# Patient Record
Sex: Female | Born: 1975 | Race: Asian | Hispanic: No | Marital: Married | Smoking: Never smoker
Health system: Southern US, Community
[De-identification: ages and names within clinical notes are randomized; demographics above are authoritative.]

---

## 2013-07-12 ENCOUNTER — Other Ambulatory Visit: Payer: Self-pay

## 2013-07-21 ENCOUNTER — Other Ambulatory Visit (HOSPITAL_COMMUNITY): Payer: Self-pay | Admitting: Obstetrics and Gynecology

## 2013-07-21 ENCOUNTER — Encounter (HOSPITAL_COMMUNITY): Payer: Self-pay | Admitting: Obstetrics and Gynecology

## 2013-07-21 DIAGNOSIS — O09529 Supervision of elderly multigravida, unspecified trimester: Secondary | ICD-10-CM

## 2013-07-21 DIAGNOSIS — IMO0001 Reserved for inherently not codable concepts without codable children: Secondary | ICD-10-CM

## 2013-07-21 DIAGNOSIS — Z3689 Encounter for other specified antenatal screening: Secondary | ICD-10-CM

## 2013-08-17 ENCOUNTER — Other Ambulatory Visit (HOSPITAL_COMMUNITY): Payer: Self-pay | Admitting: Obstetrics and Gynecology

## 2013-08-17 ENCOUNTER — Ambulatory Visit (HOSPITAL_COMMUNITY)
Admission: RE | Admit: 2013-08-17 | Discharge: 2013-08-17 | Disposition: A | Discharge status: Home / Self Care | Payer: BC Managed Care – PPO | Source: Ambulatory Visit | Attending: Obstetrics and Gynecology | Admitting: Obstetrics and Gynecology

## 2013-08-17 ENCOUNTER — Encounter (HOSPITAL_COMMUNITY): Payer: Self-pay

## 2013-08-17 ENCOUNTER — Ambulatory Visit (HOSPITAL_COMMUNITY): Payer: BC Managed Care – PPO

## 2013-08-17 VITALS — BP 115/74 | HR 90 | Wt 124.5 lb

## 2013-08-17 DIAGNOSIS — Z3689 Encounter for other specified antenatal screening: Secondary | ICD-10-CM

## 2013-08-17 DIAGNOSIS — O358XX0 Maternal care for other (suspected) fetal abnormality and damage, not applicable or unspecified: Secondary | ICD-10-CM | POA: Insufficient documentation

## 2013-08-17 DIAGNOSIS — Z363 Encounter for antenatal screening for malformations: Secondary | ICD-10-CM | POA: Insufficient documentation

## 2013-08-17 DIAGNOSIS — Z1389 Encounter for screening for other disorder: Secondary | ICD-10-CM | POA: Insufficient documentation

## 2013-08-17 DIAGNOSIS — O09529 Supervision of elderly multigravida, unspecified trimester: Secondary | ICD-10-CM | POA: Insufficient documentation

## 2013-08-17 DIAGNOSIS — IMO0001 Reserved for inherently not codable concepts without codable children: Secondary | ICD-10-CM

## 2013-08-17 DIAGNOSIS — O30009 Twin pregnancy, unspecified number of placenta and unspecified number of amniotic sacs, unspecified trimester: Secondary | ICD-10-CM | POA: Insufficient documentation

## 2013-08-17 DIAGNOSIS — O34219 Maternal care for unspecified type scar from previous cesarean delivery: Secondary | ICD-10-CM | POA: Insufficient documentation

## 2013-08-25 ENCOUNTER — Ambulatory Visit (HOSPITAL_COMMUNITY)
Admission: RE | Admit: 2013-08-25 | Discharge: 2013-08-25 | Disposition: A | Discharge status: Home / Self Care | Payer: BC Managed Care – PPO | Source: Ambulatory Visit | Attending: Obstetrics and Gynecology | Admitting: Obstetrics and Gynecology

## 2013-08-25 ENCOUNTER — Encounter (HOSPITAL_COMMUNITY): Payer: Self-pay

## 2013-08-25 ENCOUNTER — Other Ambulatory Visit (HOSPITAL_COMMUNITY): Payer: Self-pay | Admitting: Maternal and Fetal Medicine

## 2013-08-25 DIAGNOSIS — O34219 Maternal care for unspecified type scar from previous cesarean delivery: Secondary | ICD-10-CM | POA: Insufficient documentation

## 2013-08-25 DIAGNOSIS — IMO0001 Reserved for inherently not codable concepts without codable children: Secondary | ICD-10-CM

## 2013-08-25 DIAGNOSIS — O30009 Twin pregnancy, unspecified number of placenta and unspecified number of amniotic sacs, unspecified trimester: Secondary | ICD-10-CM | POA: Insufficient documentation

## 2013-08-25 DIAGNOSIS — O09529 Supervision of elderly multigravida, unspecified trimester: Secondary | ICD-10-CM | POA: Insufficient documentation

## 2013-09-02 ENCOUNTER — Ambulatory Visit (HOSPITAL_COMMUNITY)
Admission: RE | Admit: 2013-09-02 | Discharge: 2013-09-02 | Disposition: A | Discharge status: Home / Self Care | Payer: BC Managed Care – PPO | Source: Ambulatory Visit | Attending: Obstetrics and Gynecology | Admitting: Obstetrics and Gynecology

## 2013-09-02 ENCOUNTER — Other Ambulatory Visit (HOSPITAL_COMMUNITY): Payer: Self-pay | Admitting: Maternal and Fetal Medicine

## 2013-09-02 DIAGNOSIS — O34219 Maternal care for unspecified type scar from previous cesarean delivery: Secondary | ICD-10-CM | POA: Insufficient documentation

## 2013-09-02 DIAGNOSIS — IMO0001 Reserved for inherently not codable concepts without codable children: Secondary | ICD-10-CM

## 2013-09-02 DIAGNOSIS — O30009 Twin pregnancy, unspecified number of placenta and unspecified number of amniotic sacs, unspecified trimester: Secondary | ICD-10-CM | POA: Insufficient documentation

## 2013-09-02 DIAGNOSIS — O09529 Supervision of elderly multigravida, unspecified trimester: Secondary | ICD-10-CM | POA: Insufficient documentation

## 2013-09-07 ENCOUNTER — Other Ambulatory Visit (HOSPITAL_COMMUNITY): Payer: Self-pay | Admitting: Maternal and Fetal Medicine

## 2013-09-07 DIAGNOSIS — IMO0001 Reserved for inherently not codable concepts without codable children: Secondary | ICD-10-CM

## 2013-09-09 ENCOUNTER — Encounter (HOSPITAL_COMMUNITY): Payer: Self-pay

## 2013-09-09 ENCOUNTER — Ambulatory Visit (HOSPITAL_COMMUNITY)
Admission: RE | Admit: 2013-09-09 | Discharge: 2013-09-09 | Disposition: A | Discharge status: Home / Self Care | Payer: BC Managed Care – PPO | Source: Ambulatory Visit | Attending: Obstetrics and Gynecology | Admitting: Obstetrics and Gynecology

## 2013-09-09 ENCOUNTER — Ambulatory Visit (HOSPITAL_COMMUNITY): Payer: BC Managed Care – PPO

## 2013-09-09 DIAGNOSIS — O09529 Supervision of elderly multigravida, unspecified trimester: Secondary | ICD-10-CM | POA: Insufficient documentation

## 2013-09-09 DIAGNOSIS — O30009 Twin pregnancy, unspecified number of placenta and unspecified number of amniotic sacs, unspecified trimester: Secondary | ICD-10-CM | POA: Insufficient documentation

## 2013-09-09 DIAGNOSIS — O34219 Maternal care for unspecified type scar from previous cesarean delivery: Secondary | ICD-10-CM | POA: Insufficient documentation

## 2013-09-09 DIAGNOSIS — IMO0001 Reserved for inherently not codable concepts without codable children: Secondary | ICD-10-CM

## 2013-09-15 ENCOUNTER — Ambulatory Visit (HOSPITAL_COMMUNITY)
Admission: RE | Admit: 2013-09-15 | Discharge: 2013-09-15 | Disposition: A | Discharge status: Home / Self Care | Payer: BC Managed Care – PPO | Source: Ambulatory Visit | Attending: Obstetrics and Gynecology | Admitting: Obstetrics and Gynecology

## 2013-09-15 VITALS — BP 120/69 | HR 97 | Wt 135.0 lb

## 2013-09-15 DIAGNOSIS — O09529 Supervision of elderly multigravida, unspecified trimester: Secondary | ICD-10-CM | POA: Insufficient documentation

## 2013-09-15 DIAGNOSIS — O34219 Maternal care for unspecified type scar from previous cesarean delivery: Secondary | ICD-10-CM | POA: Insufficient documentation

## 2013-09-15 DIAGNOSIS — O30009 Twin pregnancy, unspecified number of placenta and unspecified number of amniotic sacs, unspecified trimester: Secondary | ICD-10-CM | POA: Insufficient documentation

## 2013-09-15 DIAGNOSIS — IMO0001 Reserved for inherently not codable concepts without codable children: Secondary | ICD-10-CM

## 2013-09-23 ENCOUNTER — Encounter (HOSPITAL_COMMUNITY): Payer: Self-pay

## 2013-09-23 ENCOUNTER — Ambulatory Visit (HOSPITAL_COMMUNITY)
Admission: RE | Admit: 2013-09-23 | Discharge: 2013-09-23 | Disposition: A | Discharge status: Home / Self Care | Payer: BC Managed Care – PPO | Source: Ambulatory Visit | Attending: Obstetrics and Gynecology | Admitting: Obstetrics and Gynecology

## 2013-09-23 DIAGNOSIS — O30009 Twin pregnancy, unspecified number of placenta and unspecified number of amniotic sacs, unspecified trimester: Secondary | ICD-10-CM | POA: Insufficient documentation

## 2013-09-23 DIAGNOSIS — O09529 Supervision of elderly multigravida, unspecified trimester: Secondary | ICD-10-CM | POA: Insufficient documentation

## 2013-09-23 DIAGNOSIS — O34219 Maternal care for unspecified type scar from previous cesarean delivery: Secondary | ICD-10-CM | POA: Insufficient documentation

## 2013-09-23 DIAGNOSIS — IMO0001 Reserved for inherently not codable concepts without codable children: Secondary | ICD-10-CM

## 2013-09-30 ENCOUNTER — Ambulatory Visit (HOSPITAL_COMMUNITY): Payer: BC Managed Care – PPO

## 2013-10-06 ENCOUNTER — Encounter (HOSPITAL_COMMUNITY): Payer: Self-pay

## 2013-10-06 ENCOUNTER — Ambulatory Visit (HOSPITAL_COMMUNITY)
Admission: RE | Admit: 2013-10-06 | Discharge: 2013-10-06 | Disposition: A | Discharge status: Home / Self Care | Payer: BC Managed Care – PPO | Source: Ambulatory Visit | Attending: Obstetrics and Gynecology | Admitting: Obstetrics and Gynecology

## 2013-10-06 ENCOUNTER — Other Ambulatory Visit (HOSPITAL_COMMUNITY): Payer: Self-pay | Admitting: Maternal and Fetal Medicine

## 2013-10-06 DIAGNOSIS — O30009 Twin pregnancy, unspecified number of placenta and unspecified number of amniotic sacs, unspecified trimester: Secondary | ICD-10-CM | POA: Insufficient documentation

## 2013-10-06 DIAGNOSIS — O09529 Supervision of elderly multigravida, unspecified trimester: Secondary | ICD-10-CM | POA: Insufficient documentation

## 2013-10-06 DIAGNOSIS — O30039 Twin pregnancy, monochorionic/diamniotic, unspecified trimester: Secondary | ICD-10-CM | POA: Insufficient documentation

## 2013-10-06 DIAGNOSIS — IMO0001 Reserved for inherently not codable concepts without codable children: Secondary | ICD-10-CM

## 2013-10-06 NOTE — Progress Notes (Signed)
[facility] ultrasound  Indication: 38 yr old G2P1001 at 7575w5d with monochorionic/diamniotic twin gestation for fetal ultrasound.  Findings: 1. Monochorionic/diamniotic twin gestation; the dividing membrane is seen. 2. Estimated fetal weight for twin A is in the 74th%; twin B is in the 57th%; the fetuses are concordant. 3. Anterior fundal placenta without evidence of previa. 4. Normal amniotic fluid volume for both fetuses. 5. Normal transvaginal cervical length. 6. The limited anatomy survey for both fetuses is normal. Normal stomach and bladder seen in each fetus.  Recommendations: 1. Appropriate fetal growth. 2. Mono/di twin pregnancy: - continue to evaluate every 2 weeks for TTTS; no evidence on today's exam of TTTS - fetal growth every 2-4 weeks - had normal fetal echocardiograms - recommend start antenatal testing at 32 weeks - recommend delivery by 37 weeks - recommend preterm labor precautions 3. Advanced maternal age: - per chart declined screening for aneuploidy  Eulis FosterKristen Ebonye Reade, MD

## 2013-10-11 ENCOUNTER — Other Ambulatory Visit (HOSPITAL_COMMUNITY): Payer: Self-pay | Admitting: Obstetrics and Gynecology

## 2013-10-11 DIAGNOSIS — O30009 Twin pregnancy, unspecified number of placenta and unspecified number of amniotic sacs, unspecified trimester: Secondary | ICD-10-CM

## 2013-10-11 DIAGNOSIS — O3421 Maternal care for scar from previous cesarean delivery: Secondary | ICD-10-CM

## 2013-10-11 DIAGNOSIS — O09529 Supervision of elderly multigravida, unspecified trimester: Secondary | ICD-10-CM

## 2013-10-20 ENCOUNTER — Ambulatory Visit (HOSPITAL_COMMUNITY)
Admission: RE | Admit: 2013-10-20 | Discharge: 2013-10-20 | Disposition: A | Discharge status: Home / Self Care | Payer: BC Managed Care – PPO | Source: Ambulatory Visit | Attending: Obstetrics and Gynecology | Admitting: Obstetrics and Gynecology

## 2013-10-20 ENCOUNTER — Other Ambulatory Visit (HOSPITAL_COMMUNITY): Payer: Self-pay | Admitting: Obstetrics and Gynecology

## 2013-10-20 ENCOUNTER — Encounter (HOSPITAL_COMMUNITY): Payer: Self-pay

## 2013-10-20 DIAGNOSIS — O09529 Supervision of elderly multigravida, unspecified trimester: Secondary | ICD-10-CM

## 2013-10-20 DIAGNOSIS — O3421 Maternal care for scar from previous cesarean delivery: Secondary | ICD-10-CM

## 2013-10-20 DIAGNOSIS — Z3689 Encounter for other specified antenatal screening: Secondary | ICD-10-CM | POA: Insufficient documentation

## 2013-10-20 DIAGNOSIS — O30009 Twin pregnancy, unspecified number of placenta and unspecified number of amniotic sacs, unspecified trimester: Secondary | ICD-10-CM

## 2013-10-20 DIAGNOSIS — O34219 Maternal care for unspecified type scar from previous cesarean delivery: Secondary | ICD-10-CM | POA: Insufficient documentation

## 2013-11-01 ENCOUNTER — Other Ambulatory Visit (HOSPITAL_COMMUNITY): Payer: Self-pay | Admitting: Obstetrics and Gynecology

## 2013-11-01 DIAGNOSIS — O09529 Supervision of elderly multigravida, unspecified trimester: Secondary | ICD-10-CM

## 2013-11-01 DIAGNOSIS — O3421 Maternal care for scar from previous cesarean delivery: Secondary | ICD-10-CM

## 2013-11-01 DIAGNOSIS — O30009 Twin pregnancy, unspecified number of placenta and unspecified number of amniotic sacs, unspecified trimester: Secondary | ICD-10-CM

## 2013-11-03 ENCOUNTER — Other Ambulatory Visit (HOSPITAL_COMMUNITY): Payer: Self-pay | Admitting: Obstetrics and Gynecology

## 2013-11-03 ENCOUNTER — Encounter (HOSPITAL_COMMUNITY): Payer: Self-pay

## 2013-11-03 ENCOUNTER — Ambulatory Visit (HOSPITAL_COMMUNITY)
Admission: RE | Admit: 2013-11-03 | Discharge: 2013-11-03 | Disposition: A | Discharge status: Home / Self Care | Payer: BC Managed Care – PPO | Source: Ambulatory Visit | Attending: Obstetrics and Gynecology | Admitting: Obstetrics and Gynecology

## 2013-11-03 DIAGNOSIS — O3421 Maternal care for scar from previous cesarean delivery: Secondary | ICD-10-CM

## 2013-11-03 DIAGNOSIS — O34219 Maternal care for unspecified type scar from previous cesarean delivery: Secondary | ICD-10-CM | POA: Insufficient documentation

## 2013-11-03 DIAGNOSIS — O30009 Twin pregnancy, unspecified number of placenta and unspecified number of amniotic sacs, unspecified trimester: Secondary | ICD-10-CM

## 2013-11-03 DIAGNOSIS — O09529 Supervision of elderly multigravida, unspecified trimester: Secondary | ICD-10-CM

## 2013-11-09 ENCOUNTER — Other Ambulatory Visit (HOSPITAL_COMMUNITY): Payer: Self-pay | Admitting: Obstetrics and Gynecology

## 2013-11-09 DIAGNOSIS — O09529 Supervision of elderly multigravida, unspecified trimester: Secondary | ICD-10-CM

## 2013-11-09 DIAGNOSIS — O30009 Twin pregnancy, unspecified number of placenta and unspecified number of amniotic sacs, unspecified trimester: Secondary | ICD-10-CM

## 2013-11-09 DIAGNOSIS — O3421 Maternal care for scar from previous cesarean delivery: Secondary | ICD-10-CM

## 2013-11-17 ENCOUNTER — Encounter (HOSPITAL_COMMUNITY): Payer: Self-pay

## 2013-11-17 ENCOUNTER — Ambulatory Visit (HOSPITAL_COMMUNITY)
Admission: RE | Admit: 2013-11-17 | Discharge: 2013-11-17 | Disposition: A | Discharge status: Home / Self Care | Payer: BC Managed Care – PPO | Source: Ambulatory Visit | Attending: Obstetrics and Gynecology | Admitting: Obstetrics and Gynecology

## 2013-11-17 VITALS — BP 118/70 | HR 102 | Wt 157.0 lb

## 2013-11-17 DIAGNOSIS — O3421 Maternal care for scar from previous cesarean delivery: Secondary | ICD-10-CM

## 2013-11-17 DIAGNOSIS — O30009 Twin pregnancy, unspecified number of placenta and unspecified number of amniotic sacs, unspecified trimester: Secondary | ICD-10-CM | POA: Insufficient documentation

## 2013-11-17 DIAGNOSIS — O30039 Twin pregnancy, monochorionic/diamniotic, unspecified trimester: Secondary | ICD-10-CM | POA: Insufficient documentation

## 2013-11-17 DIAGNOSIS — O34219 Maternal care for unspecified type scar from previous cesarean delivery: Secondary | ICD-10-CM | POA: Insufficient documentation

## 2013-11-17 DIAGNOSIS — O09529 Supervision of elderly multigravida, unspecified trimester: Secondary | ICD-10-CM

## 2013-11-17 NOTE — Progress Notes (Signed)
[facility] ultrasound  Indication: 38 yr old G2P1001 at [redacted]w[redacted]d with monochorionic/diamniotic twin gestation for fetal ultrasound.  Findings: 1. Monochorionic/diamniotic twin gestation; the dividing membrane is seen. 2. Anterior fundal placenta without evidence of previa. 3. Normal amniotic fluid volume for both fetuses. 4. The limited anatomy survey for both fetuses is normal. Normal stomach and bladder seen in each fetus.  Recommendations: 1. Mono/di twin pregnancy: - continue to evaluate every 2 weeks for TTTS; no evidence on today's exam of TTTS - fetal growth every 2-4 weeks; due in 2 weeks - had normal fetal echocardiograms - recommend start antenatal testing at 32 weeks - recommend delivery by 37 weeks; delivery at 36-37 weeks if pregnancy remains uncomplicated - recommend preterm labor precautions 2. Advanced maternal age: - per chart declined screening for aneuploidy  Eulis Foster, MD

## 2013-12-01 ENCOUNTER — Ambulatory Visit (HOSPITAL_COMMUNITY)
Admission: RE | Admit: 2013-12-01 | Discharge: 2013-12-01 | Disposition: A | Discharge status: Home / Self Care | Payer: BC Managed Care – PPO | Source: Ambulatory Visit | Attending: Obstetrics and Gynecology | Admitting: Obstetrics and Gynecology

## 2013-12-01 DIAGNOSIS — O30009 Twin pregnancy, unspecified number of placenta and unspecified number of amniotic sacs, unspecified trimester: Secondary | ICD-10-CM | POA: Insufficient documentation

## 2013-12-01 DIAGNOSIS — Z3689 Encounter for other specified antenatal screening: Secondary | ICD-10-CM | POA: Insufficient documentation

## 2014-04-10 ENCOUNTER — Encounter (HOSPITAL_COMMUNITY): Payer: Self-pay

## 2014-06-22 ENCOUNTER — Encounter (HOSPITAL_COMMUNITY): Payer: Self-pay | Admitting: *Deleted

## 2015-04-28 IMAGING — US US OB FOLLOW-UP EACH ADDL GEST (MODIFY)
1 series · 14 of 28 positions shown · non-contrast
Comparison: none

[Series 1: us ob follow-up each addl gest (modify) · 0.23mm/px · 14 of 53 slices shown]
[im 2/53]
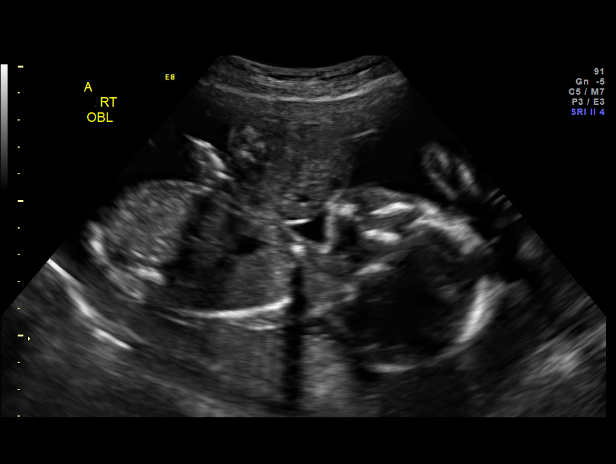
[im 6/53]
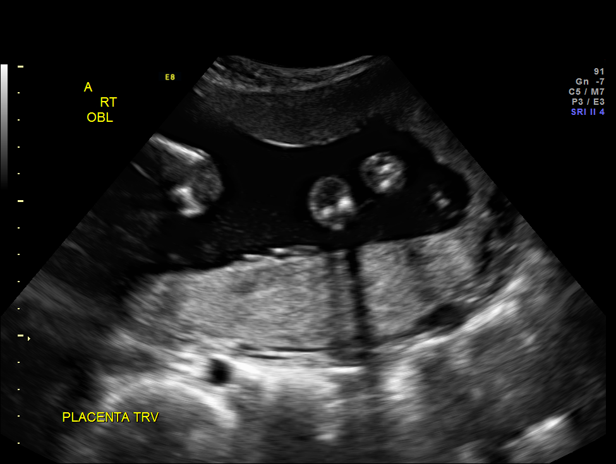
[im 10/53]
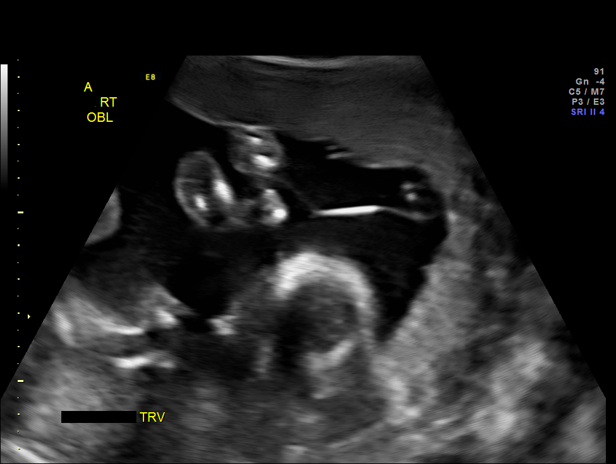
[im 14/53]
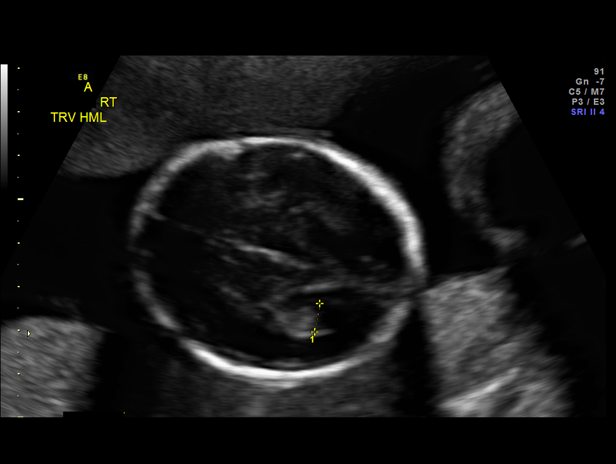
[im 18/53]
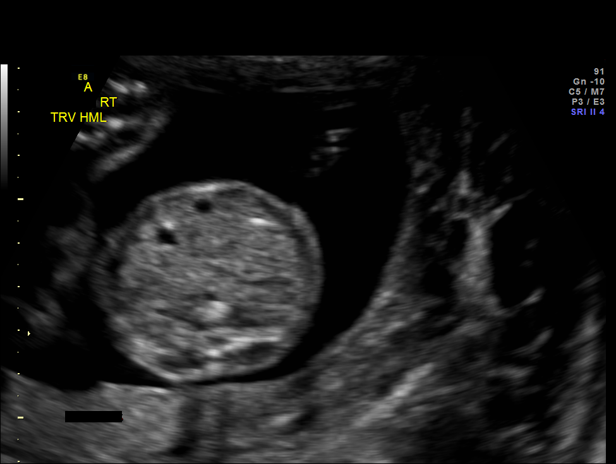
[im 22/53]
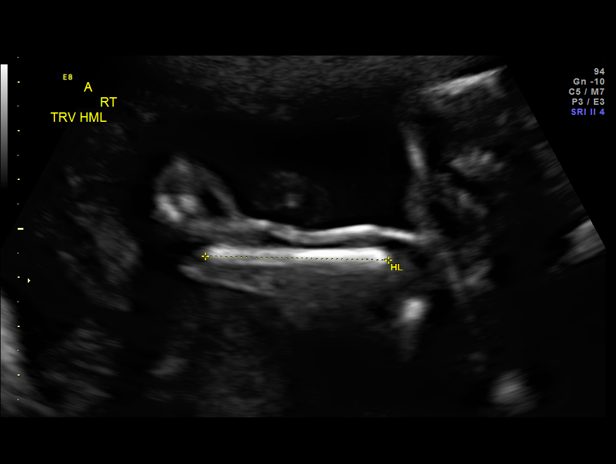
[im 26/53]
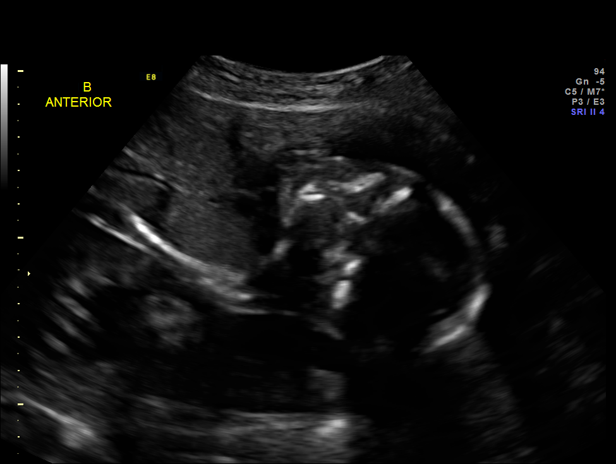
[im 29/53]
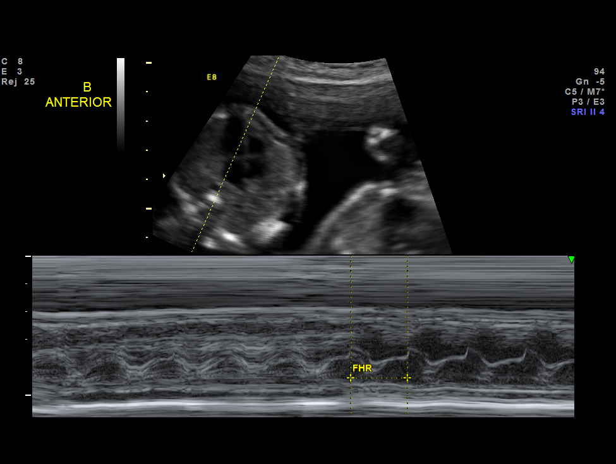
[im 33/53]
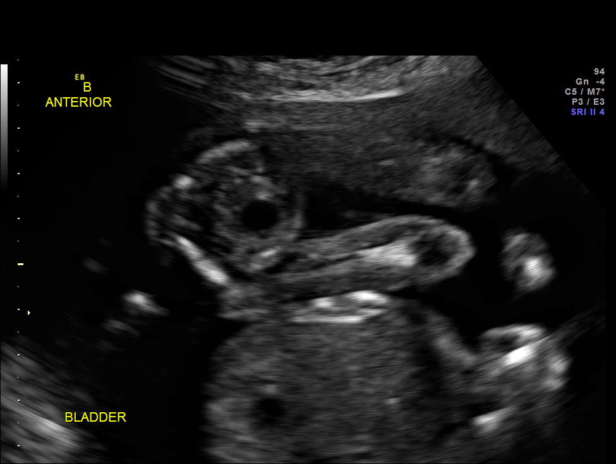
[im 37/53]
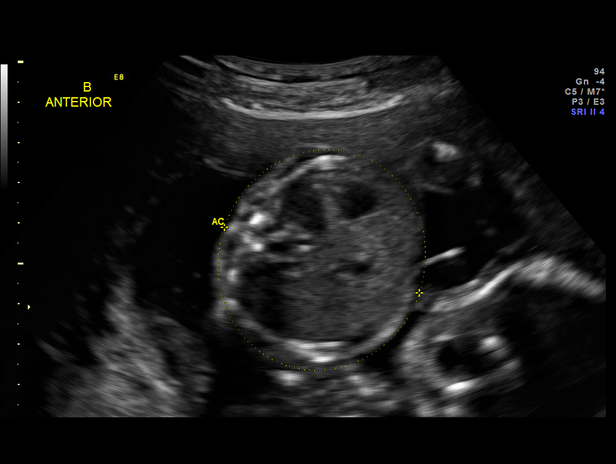
[im 41/53]
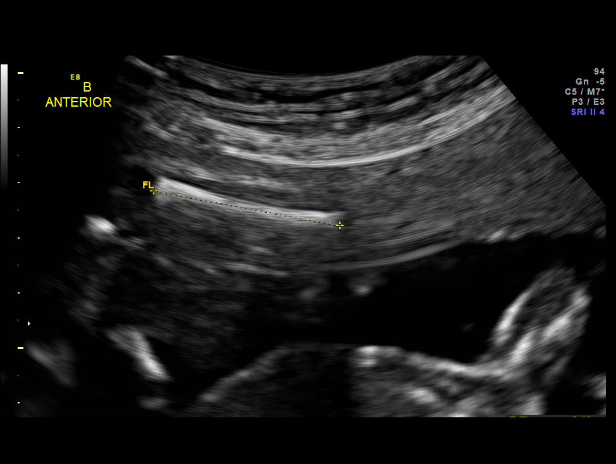
[im 45/53]
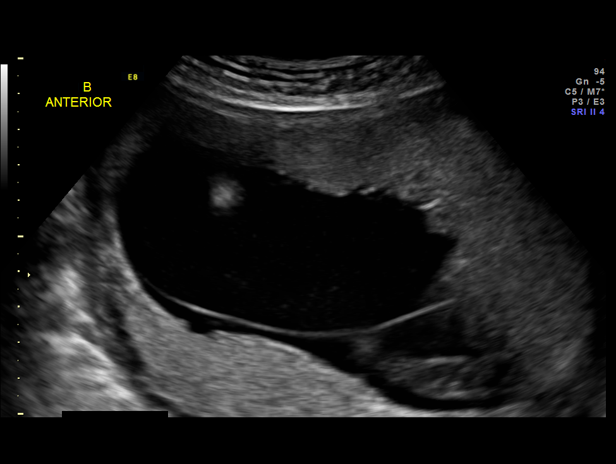
[im 49/53]
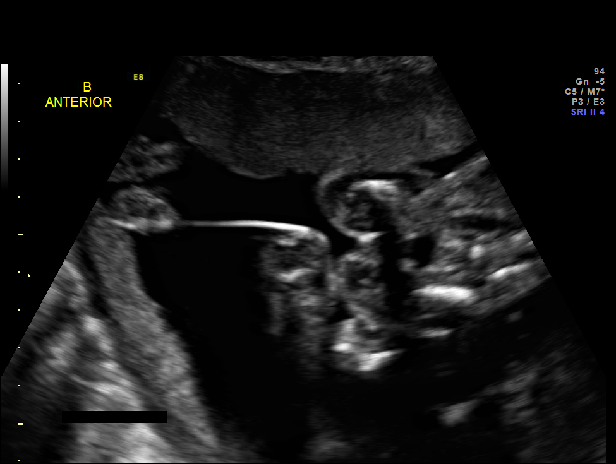
[im 53/53]
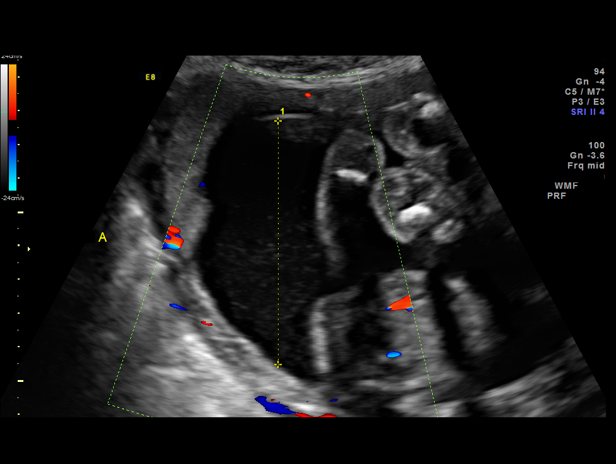

[14 of 28 positions shown; findings below may reference images not displayed]

OBSTETRICS REPORT
                      (Signed Final 09/15/2013 [DATE])

Service(s) Provided

 US OB FOLLOW UP                                       76816.1
 US OB FOLLOW UP ADDL GEST                             76816.2
Indications

 Twin gestation, [place]
 History of cesarean delivery, currently pregnant      654.20,
 Advanced maternal age (AMA), Multigravida
Fetal Evaluation (Fetus A)

 Num Of Fetuses:    2
 Fetal Heart Rate:  147                          bpm
 Cardiac Activity:  Observed
 Fetal Lie:         Lower/Posterior Fetus
 Presentation:      Variable
 Placenta:          Posterior fundal, above
                    cervical os
 P. Cord            Previously Visualized
 Insertion:

 Membrane Desc:     Dividing
                    Membrane seen
                    - Monochorionic

 Amniotic Fluid
 AFI FV:      Subjectively within normal limits
                                             Larg Pckt:     4.4  cm
Biometry (Fetus A)

 BPD:     53.9  mm     G. Age:  22w 3d                CI:         81.3   70 - 86
 OFD:     66.3  mm                                    FL/HC:      18.6   18.4 -

 HC:     192.1  mm     G. Age:  21w 4d       29  %    HC/AC:      1.06   1.06 -

 AC:     180.6  mm     G. Age:  22w 6d       79  %    FL/BPD:     66.4   71 - 87
 FL:      35.8  mm     G. Age:  21w 2d       28  %    FL/AC:      19.8   20 - 24
 HUM:       37  mm     G. Age:  22w 6d       79  %

 Est. FW:     480  gm      1 lb 1 oz     53  %     FW Discordancy     0 \ 12 %
Gestational Age (Fetus A)

 LMP:           20w 4d        Date:  04/24/13                 EDD:   01/29/14
 U/S Today:     22w 0d                                        EDD:   01/19/14
 Best:          21w 5d     Det. By:  Early Ultrasound         EDD:   01/21/14
Anatomy (Fetus A)

 Cranium:          Appears normal         Aortic Arch:      Previously seen
 Fetal Cavum:      Appears normal         Ductal Arch:      Previously seen
 Ventricles:       Appears normal         Diaphragm:        Previously seen
 Choroid Plexus:   Previously seen        Stomach:          Appears normal, left
                                                            sided
 Cerebellum:       Previously seen        Abdomen:          Appears normal
 Posterior Fossa:  Previously seen        Abdominal Wall:   Previously seen
 Nuchal Fold:      Previously seen        Cord Vessels:     Previously seen
 Face:             Orbits and profile     Kidneys:          Appear normal
                   previously seen
 Lips:             Previously seen        Bladder:          Appears normal
 Heart:            Appears normal         Spine:            Previously seen
                   (4CH, axis, and
                   situs)
 RVOT:             Previously seen        Lower             Previously seen
                                          Extremities:
 LVOT:             Previously seen        Upper             Previously seen
                                          Extremities:

 Other:  Fetus appears to be a female. Heels and 5th digit previously
         visualized. Nasal bone previously visualized.

Fetal Evaluation (Fetus B)

 Num Of Fetuses:    2
 Fetal Heart Rate:  150                          bpm
 Cardiac Activity:  Observed
 Fetal Lie:         Upper/Anterior Fetus
 Presentation:      Cephalic
 Placenta:          Anterior Fundal, above
                    cervical os
 P. Cord            Previously Visualized
 Insertion:

 Membrane Desc:     Dividing
                    Membrane seen
                    - Monochorionic

 Amniotic Fluid
 AFI FV:      Subjectively within normal limits
                                             Larg Pckt:     3.0  cm
Biometry (Fetus B)

 BPD:     53.8  mm     G. Age:  22w 2d                CI:         80.7   70 - 86
 OFD:     66.7  mm                                    FL/HC:      17.7   18.4 -

 HC:     195.9  mm     G. Age:  21w 6d       43  %    HC/AC:      1.18   1.06 -

 AC:     165.4  mm     G. Age:  21w 4d       39  %    FL/BPD:     64.5   71 - 87
 FL:      34.7  mm     G. Age:  20w 6d       18  %    FL/AC:      21.0   20 - 24
 HUM:     33.6  mm     G. Age:  21w 3d       41  %

 Est. FW:     420  gm    0 lb 15 oz      36  %     FW Discordancy        12  %
Gestational Age (Fetus B)
 LMP:           20w 4d        Date:  04/24/13                 EDD:   01/29/14
 U/S Today:     21w 5d                                        EDD:   01/21/14
 Best:          21w 5d     Det. By:  Early Ultrasound         EDD:   01/21/14
Anatomy (Fetus B)

 Cranium:          Appears normal         Aortic Arch:      Previously seen
 Fetal Cavum:      Appears normal         Ductal Arch:      Previously seen
 Ventricles:       Appears normal         Diaphragm:        Previously seen
 Choroid Plexus:   Previously seen        Stomach:          Appears normal, left
                                                            sided
 Cerebellum:       Previously seen        Abdomen:          Appears normal
 Posterior Fossa:  Previously seen        Abdominal Wall:   Previously seen
 Nuchal Fold:      Previously seen        Cord Vessels:     Previously seen
 Face:             Orbits and profile     Kidneys:          Appear normal
                   previously seen
 Lips:             Previously seen        Bladder:          Appears normal
 Heart:            Appears normal         Spine:            Previously seen
                   (4CH, axis, and
                   situs)
 RVOT:             Previously seen        Lower             Previously seen
                                          Extremities:
 LVOT:             Previously seen        Upper             Previously seen
                                          Extremities:

 Other:  Fetus appears to be a female. Heels and 5th digit previously
         visualized. Nasal bone previously visualized.
Cervix Uterus Adnexa

 Cervical Length:    3        cm

 Cervix:       Normal appearance by transabdominal scan.

 Adnexa:     No abnormality visualized.
Impression

 MC/DA twin gestation with best dates of 21 [DATE] weeks
 Normal fetal echos x 2
 12% intertwin discordance noted

 Twin A:
 Maternal right/ posterior fetus, variable presentation, posterior
 fundal placenta
 Bladder visualized
 Normal amniotic fluid volume (max vertical pocket 4.4 cm)
 Estimated fetal weight at 53rd %tile

 Twin B:
 Maternal left/ anterior fetus, cephalic presentation, anterior
 fundal placenta
 Bladder visualized
 Normal amniotic fluid volume (max vertical pocket 3 cm)
 Estimated fetal weight at 36th %tile

Recommendations

 Recommend weekly follow up x 2 (for limited ultrasounds /
 Max verticle pockets) due to some amniotic fluid discordance
 If stable, will decrease surveillance to every other week
 Follow up for interval growth in 4 weeks.

 questions or concerns.

## 2015-07-14 IMAGING — US US OB FOLLOW-UP EACH ADDL GEST (MODIFY)
1 series · 15 of 28 positions shown · non-contrast
Comparison: none

[Series 1: us ob follow-up each addl gest (modify) · 0.23mm/px · 64 acquisitions, 15 frames shown]
[im 1/64]
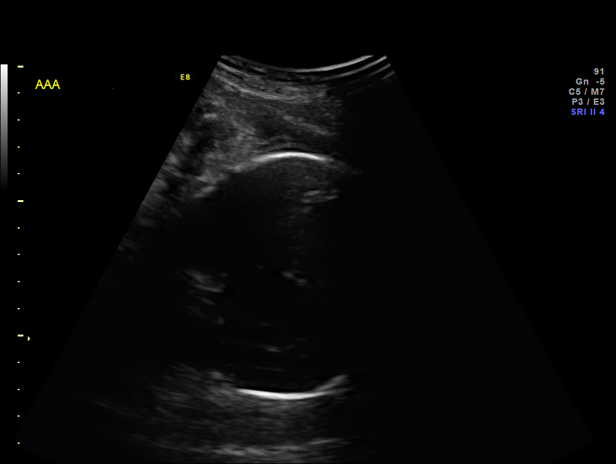
[im 5/64]
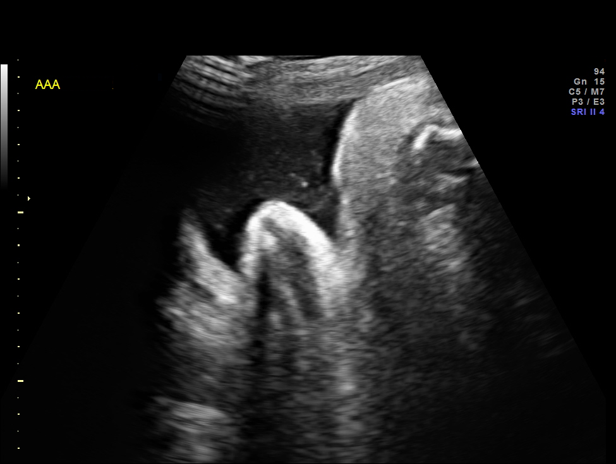
[im 10/64]
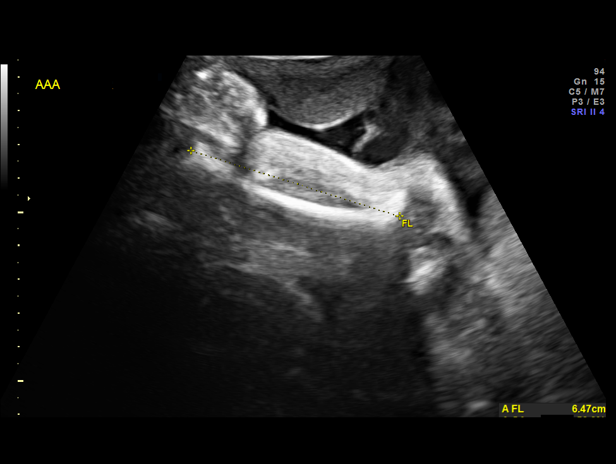
[im 15/64]
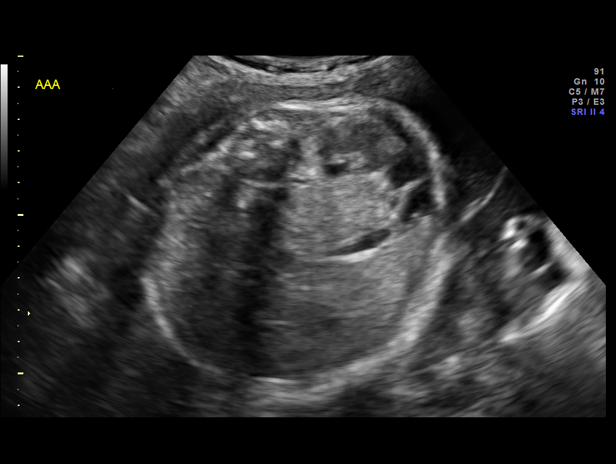
[im 19/64]
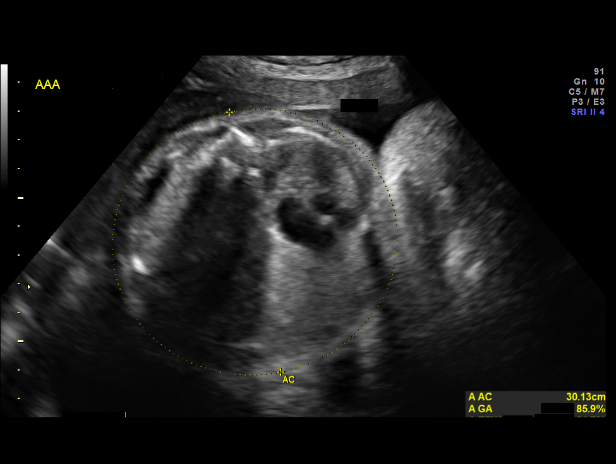
[im 24/64]
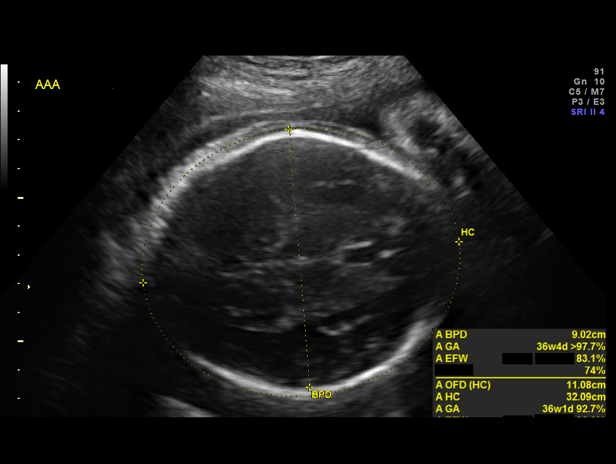
[im 29/64]
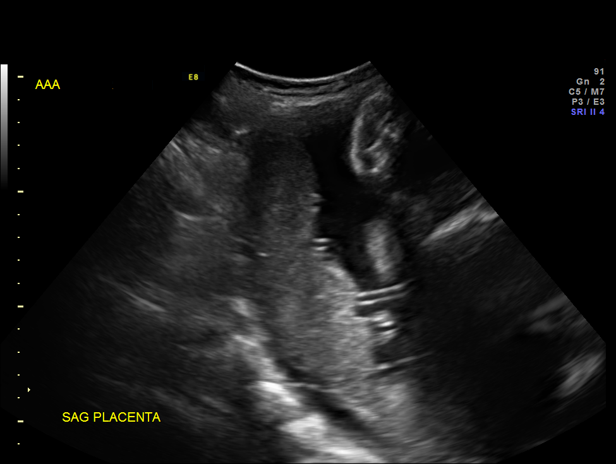
[im 33/64]
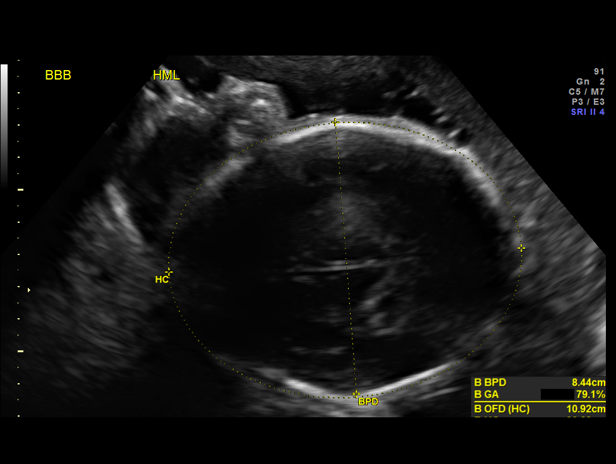
[im 36/64]
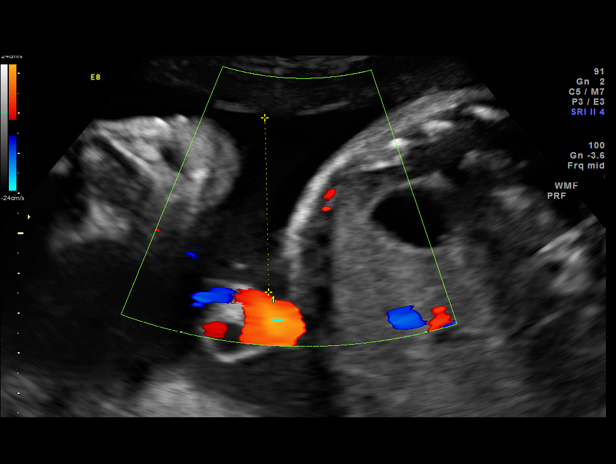
[im 40/64]
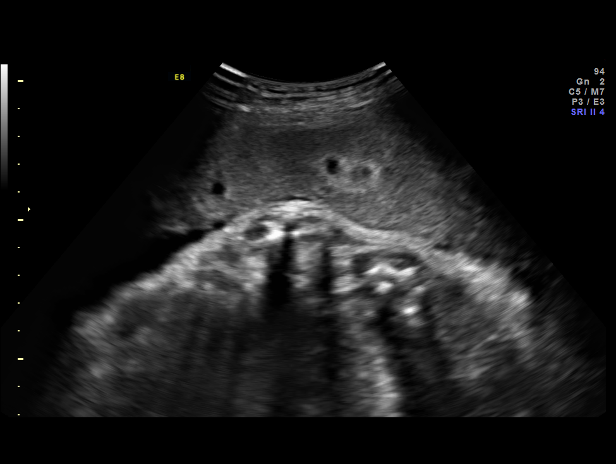
[im 45/64]
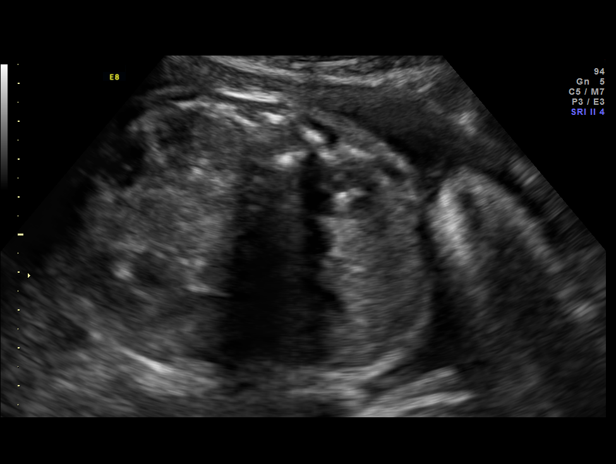
[im 50/64]
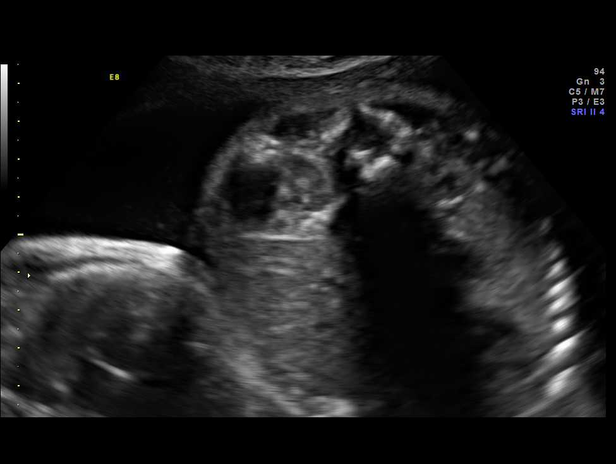
[im 54/64]
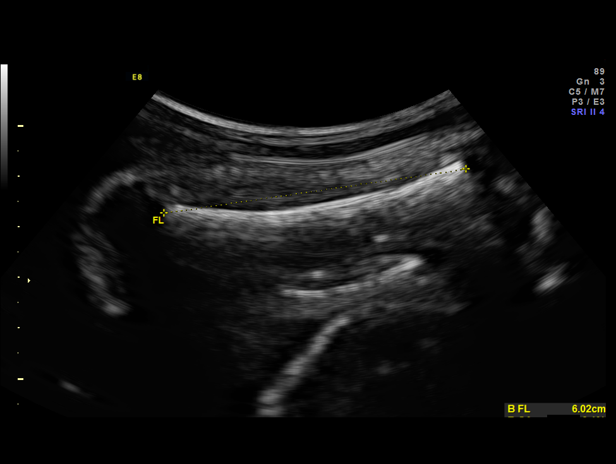
[im 59/64]
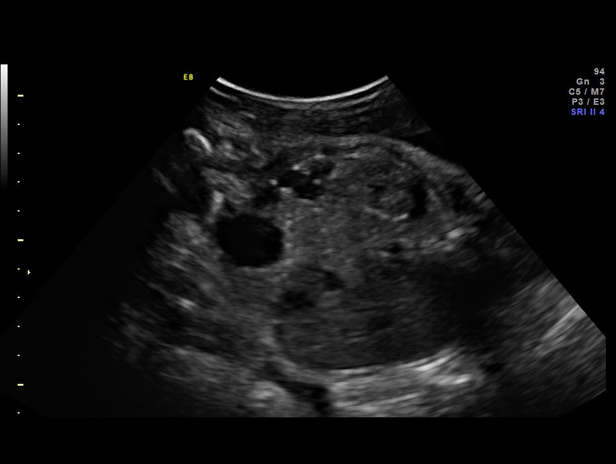
[im 64/64]
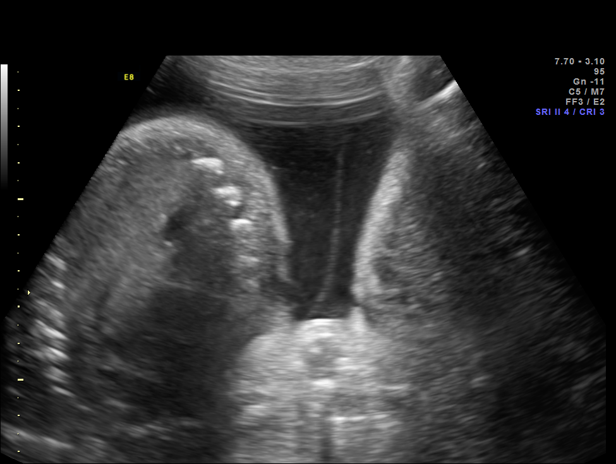

[15 of 28 positions shown; findings below may reference images not displayed]

OBSTETRICS REPORT
                      (Signed Final 12/01/2013 [DATE])

Service(s) Provided

 US OB FOLLOW UP                                       76816.1
 US OB FOLLOW UP ADDL GEST                             76816.2
Indications

 Twin gestation, [place]
 History of cesarean delivery, currently pregnant      654.20,
 Advanced maternal age (37), Multigravida;
 declined all testing
Fetal Evaluation (Fetus A)

 Num Of Fetuses:    2
 Fetal Heart Rate:  161                          bpm
 Cardiac Activity:  Observed
 Fetal Lie:         Lower Fetus
 Presentation:      Cephalic
 Placenta:          Posterior Fundal, above
                    cervical os
 P. Cord            Previously Visualized
 Insertion:

 Membrane Desc:     Dividing
                    Membrane seen
                    - Dichorionic.

 Amniotic Fluid
 AFI FV:      Subjectively within normal limits
                                             Larg Pckt:    4.27  cm
Biometry (Fetus A)

 BPD:     89.1  mm     G. Age:  36w 0d                CI:        77.37   70 - 86
                                                      FL/HC:      20.6   19.9 -

 HC:     320.7  mm     G. Age:  36w 1d       92  %    HC/AC:      1.06   0.96 -

 AC:     302.2  mm     G. Age:  34w 2d       87  %    FL/BPD:     74.2   71 - 87
 FL:      66.1  mm     G. Age:  34w 0d       74  %    FL/AC:      21.9   20 - 24
 HUM:     60.2  mm     G. Age:  35w 0d     > 95  %

 Est. FW:    8018  gm      5 lb 6 oz     83  %     FW Discordancy     0 \ 11 %
Gestational Age (Fetus A)
 LMP:           31w 4d        Date:  04/24/13                 EDD:   01/29/14
 U/S Today:     35w 1d                                        EDD:   01/04/14
 Best:          32w 5d     Det. By:  Early Ultrasound         EDD:   01/21/14
Anatomy (Fetus A)

 Cranium:          Appears normal         Aortic Arch:      Previously seen
 Fetal Cavum:      Previously seen        Ductal Arch:      Previously seen
 Ventricles:       Appears normal         Diaphragm:        Appears normal
 Choroid Plexus:   Previously seen        Stomach:          Appears normal, left
                                                            sided
 Cerebellum:       Previously seen        Abdomen:          Previously seen
 Posterior Fossa:  Previously seen        Abdominal Wall:   Previously seen
 Nuchal Fold:      Previously seen        Cord Vessels:     Previously seen
 Face:             Orbits and profile     Kidneys:          Appear normal
                   previously seen
 Lips:             Previously seen        Bladder:          Appears normal
 Heart:            Previously seen        Spine:            Previously seen
 RVOT:             Previously seen        Lower             Previously seen
                                          Extremities:
 LVOT:             Previously seen        Upper             Previously seen
                                          Extremities:

 Other:  Female gender previously seen. Heels and 5th digit previously seen.
         Nasal bone previously visualized.

Fetal Evaluation (Fetus B)

 Num Of Fetuses:    2
 Fetal Heart Rate:  138                          bpm
 Cardiac Activity:  Observed
 Fetal Lie:         Upper Fetus
 Presentation:      Transverse, head to
                    maternal left
 Placenta:          Anterior Fundal, above
                    cervical os
 P. Cord            Previously Visualized
 Insertion:

 Amniotic Fluid
 AFI FV:      Subjectively within normal limits
                                             Larg Pckt:    4.37  cm
Biometry (Fetus B)

 BPD:     83.9  mm     G. Age:  33w 5d                CI:        74.98   70 - 86
                                                      FL/HC:      19.7   19.9 -

 HC:     307.4  mm     G. Age:  34w 2d       54  %    HC/AC:      1.02   0.96 -

 AC:     301.4  mm     G. Age:  34w 1d       85  %    FL/BPD:     72.3   71 - 87
 FL:      60.7  mm     G. Age:  31w 4d       13  %    FL/AC:      20.1   20 - 24
 HUM:     58.2  mm     G. Age:  33w 5d       78  %

 Est. FW:    5366  gm    4 lb 13 oz      71  %     FW Discordancy        11  %
Gestational Age (Fetus B)
 LMP:           31w 4d        Date:  04/24/13                 EDD:   01/29/14
 U/S Today:     33w 3d                                        EDD:   01/16/14
 Best:          32w 5d     Det. By:  Early Ultrasound         EDD:   01/21/14
Anatomy (Fetus B)

 Cranium:          Appears normal         Aortic Arch:      Previously seen
 Fetal Cavum:      Previously seen        Ductal Arch:      Previously seen
 Ventricles:       Appears normal         Diaphragm:        Appears normal
 Choroid Plexus:   Previously seen        Stomach:          Appears normal, left
                                                            sided
 Cerebellum:       Previously seen        Abdomen:          Previously seen
 Posterior Fossa:  Previously seen        Abdominal Wall:   Previously seen
 Nuchal Fold:      Previously seen        Cord Vessels:     Previously seen
 Face:             Orbits and profile     Kidneys:          Appear normal
                   previously seen
 Lips:             Previously seen        Bladder:          Appears normal
 Heart:            Previously seen        Spine:            Previously seen
 RVOT:             Previously seen        Lower             Previously seen
                                          Extremities:
 LVOT:             Previously seen        Upper             Previously seen
                                          Extremities:

 Other:  Fetus appears to be a female. Heels and 5th digit previously
         visualized.
Cervix Uterus Adnexa

 Cervix:       Not visualized (advanced GA >28wks)
 Uterus:       No abnormality visualized.
 Cul De Sac:   No free fluid seen.

 Left Ovary:    Not visualized.
 Right Ovary:   Not visualized.
 Adnexa:     No abnormality visualized.
Impression

 Monochorionic/diamniotic twin pregnancy at 32+5 weeks
 Cephalic/transverse presentation
 Normal interval anatomy; anatomic survey complete x 2
 Normal amniotic fluid volume x 2
 Appropriate interval growths with EFWs at the 83rd and 71st
 %tile
Recommendations

 Begin weekly BPPs
 Follow-up ultrasound in 2 weeks to reassess AFVs
 Ms. Waitoto asked to be transferred to [facility]

 questions or concerns.
